# Patient Record
Sex: Male | Born: 1975 | Race: White | Hispanic: No | Marital: Married | Smoking: Never smoker
Health system: Southern US, Community
[De-identification: ages and names within clinical notes are randomized; demographics above are authoritative.]

## PROBLEM LIST (undated history)

## (undated) DIAGNOSIS — F419 Anxiety disorder, unspecified: Secondary | ICD-10-CM

## (undated) HISTORY — PX: LEG SURGERY: SHX1003

---

## 2001-08-06 ENCOUNTER — Ambulatory Visit (HOSPITAL_COMMUNITY): Admission: RE | Admit: 2001-08-06 | Discharge: 2001-08-06 | Payer: Self-pay | Admitting: Surgery

## 2001-08-06 ENCOUNTER — Encounter: Payer: Self-pay | Admitting: Surgery

## 2001-09-21 ENCOUNTER — Observation Stay (HOSPITAL_COMMUNITY): Admission: RE | Admit: 2001-09-21 | Discharge: 2001-09-22 | Payer: Self-pay | Admitting: Surgery

## 2001-12-19 ENCOUNTER — Ambulatory Visit: Admission: RE | Admit: 2001-12-19 | Discharge: 2002-03-19 | Payer: Self-pay | Admitting: Radiation Oncology

## 2005-08-04 ENCOUNTER — Emergency Department (HOSPITAL_COMMUNITY): Admission: EM | Admit: 2005-08-04 | Discharge: 2005-08-05 | Payer: Self-pay | Admitting: Emergency Medicine

## 2006-09-24 IMAGING — CT CT HEAD WO/W CM
1 of 2 series · 13 of 30 positions shown, 17 images · IV contrast (omnipaque)
Comparison: No prior studies.

CLINICAL DATA: History of sarcoma.  Altered level of consciousness.
HEAD CT WITHOUT AND WITH CONTRAST:
TECHNIQUE: Contiguous axial images were obtained from the base of the skull through the vertex according to standard protocol before and after administration of intravenous contrast.
Contrast:  100 cc Omnipaque 300

[Series 2: head_seq 4.5 h45s st · axial · 0.43mm/px · z∈[+1313,+1448]mm · 13 of 36 slices shown, 17 images]
[im 3/36  brain]
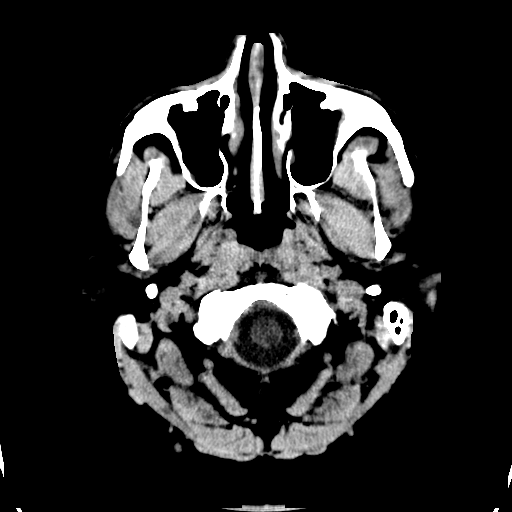
[im 3/36  bone]
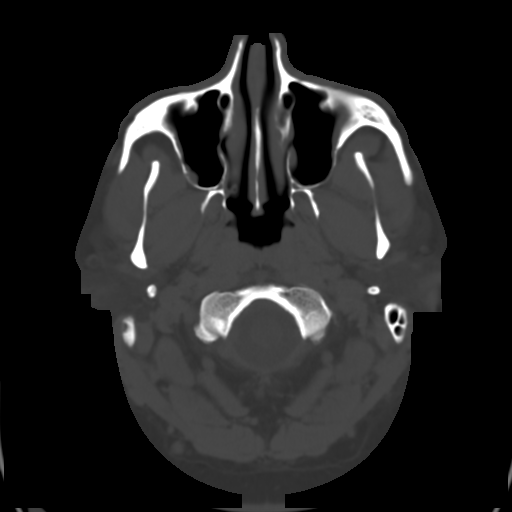
[im 6/36  brain]
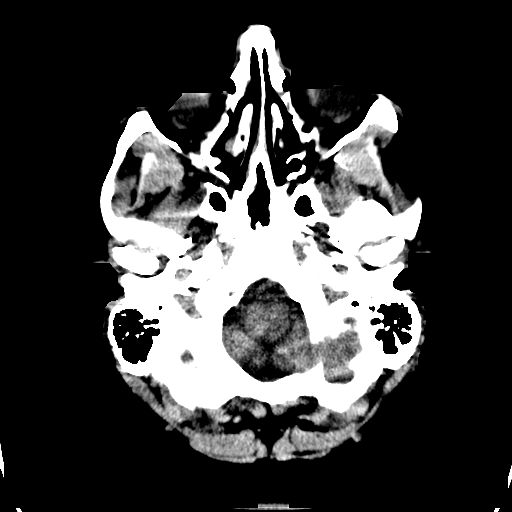
[im 8/36  brain]
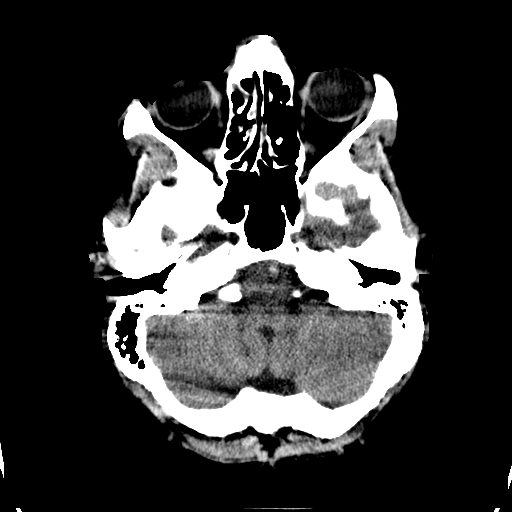
[im 11/36  brain]
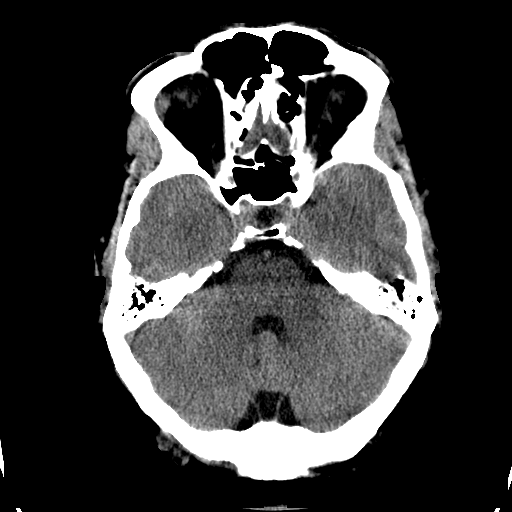
[im 13/36  brain]
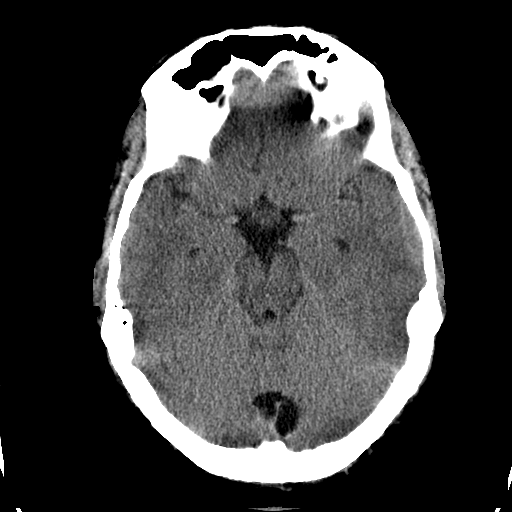
[im 13/36  bone]
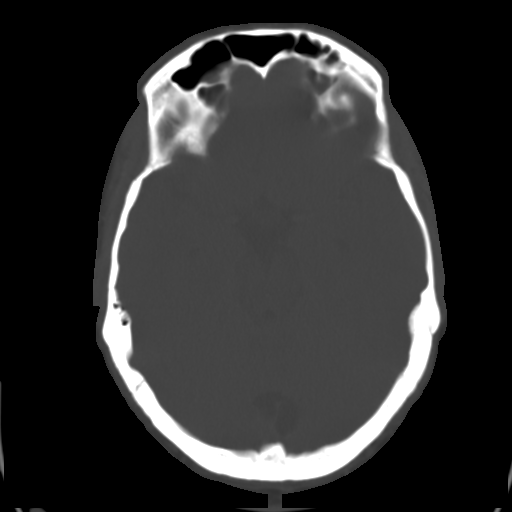
[im 16/36  brain]
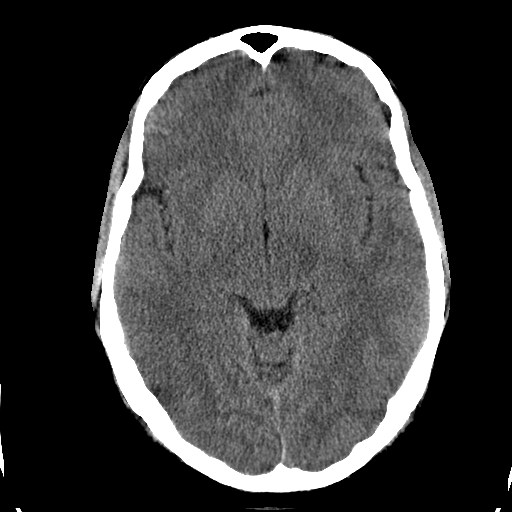
[im 18/36  brain]
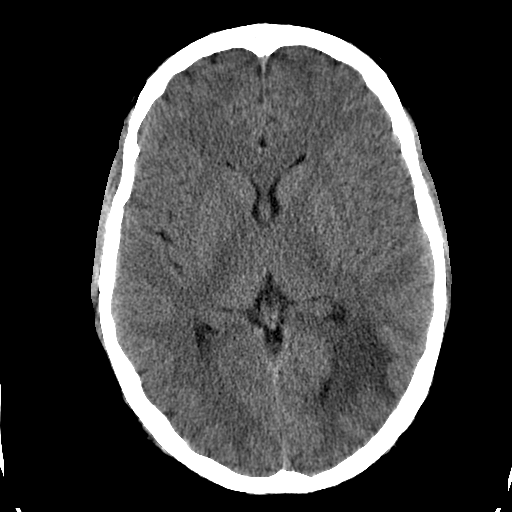
[im 21/36  brain]
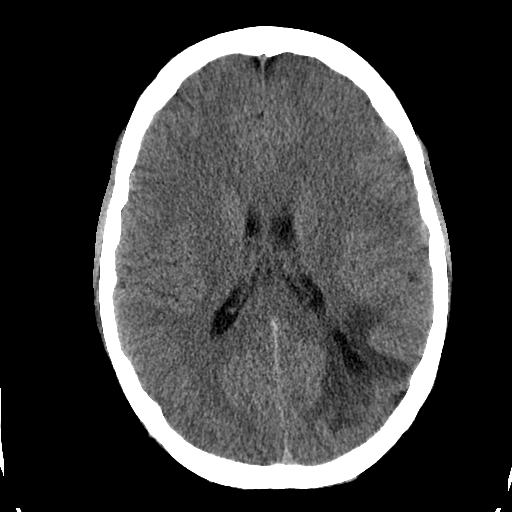
[im 23/36  brain]
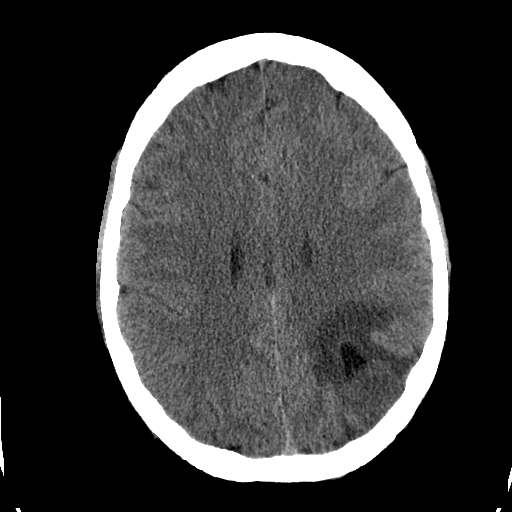
[im 23/36  bone]
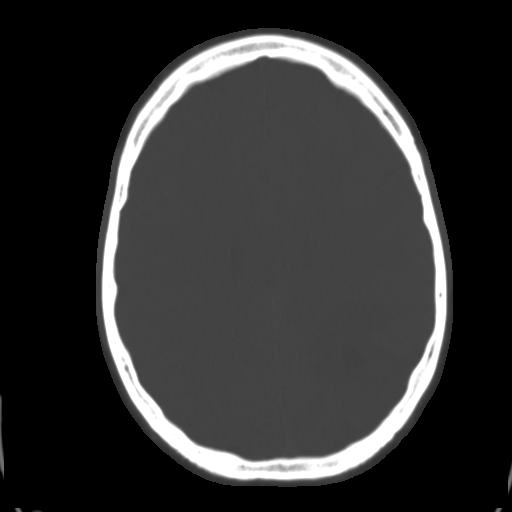
[im 26/36  brain]
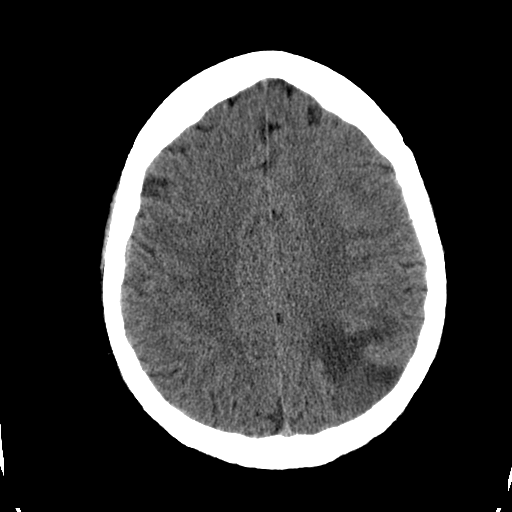
[im 28/36  brain]
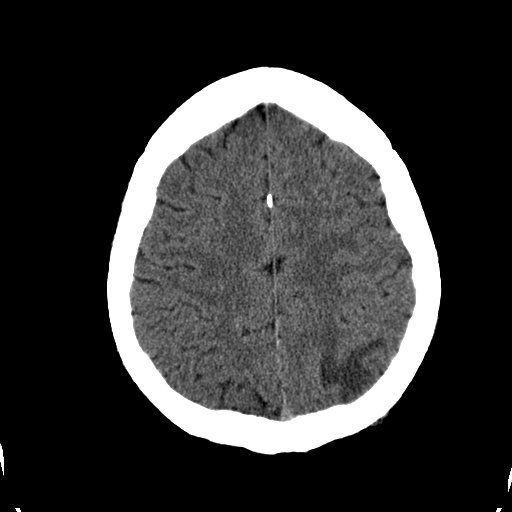
[im 31/36  brain]
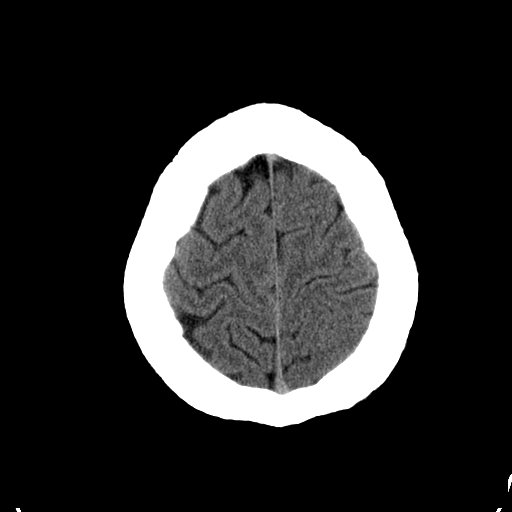
[im 33/36  brain]
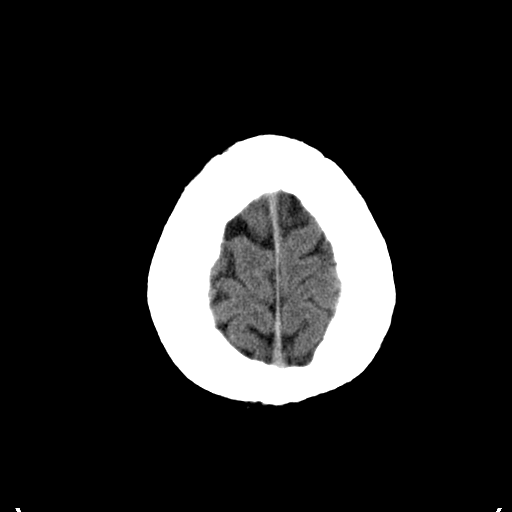
[im 33/36  bone]
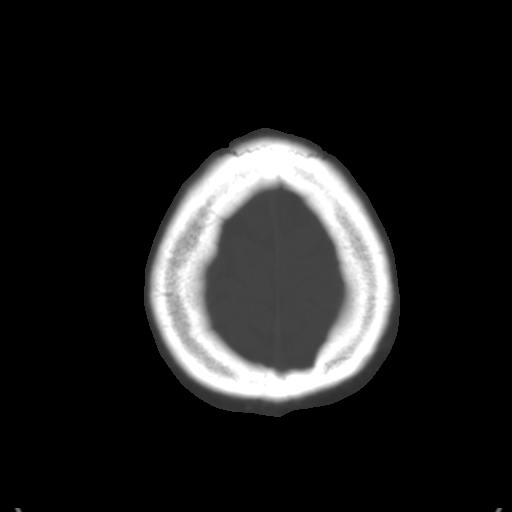

[13 of 30 positions shown; findings below may reference images not displayed]

FINDINGS: There is a 2.8 x 2.2 cm rounded enhancing mass in the left parietal lobe as shown on image 23 of series 4, with surrounding vasogenic edema.  The left lateral ventricle has an unusual appearance in association with this mass, extending cephalad in a dilated fashion to subtend this mass.  This may reflect a prior infarct underlying the mass.  Although this mass does enhance, it does not enhance avidly and sensitivity in detecting small additional metastatic lesions to the brain would be considerably higher on MRI than on today?s CT.  I do not visualize any additional intracranial lesions.  
There is chronic maxillary and ethmoid sinusitis.  The mass does not appear to be eroding the inner table of the skull although it does sit immediately adjacent to the dura.
IMPRESSION: 2.2 x 2.8 cm mass in the left parietal lobe with surrounding vasogenic edema, and ipsilateral dilatation of the occipital horn of the left lateral ventricle extending to this mass, likely due to encephalomalacia of the subtending white matter.

## 2006-10-13 ENCOUNTER — Ambulatory Visit (HOSPITAL_BASED_OUTPATIENT_CLINIC_OR_DEPARTMENT_OTHER): Admission: RE | Admit: 2006-10-13 | Discharge: 2006-10-13 | Payer: Self-pay | Admitting: Orthopedic Surgery

## 2017-09-17 ENCOUNTER — Emergency Department (HOSPITAL_BASED_OUTPATIENT_CLINIC_OR_DEPARTMENT_OTHER)
Admission: EM | Admit: 2017-09-17 | Discharge: 2017-09-17 | Disposition: A | Discharge status: Home / Self Care | Payer: PRIVATE HEALTH INSURANCE | Attending: Physician Assistant | Admitting: Physician Assistant

## 2017-09-17 ENCOUNTER — Other Ambulatory Visit: Payer: Self-pay

## 2017-09-17 ENCOUNTER — Encounter (HOSPITAL_BASED_OUTPATIENT_CLINIC_OR_DEPARTMENT_OTHER): Payer: Self-pay | Admitting: Emergency Medicine

## 2017-09-17 ENCOUNTER — Emergency Department (HOSPITAL_BASED_OUTPATIENT_CLINIC_OR_DEPARTMENT_OTHER): Payer: PRIVATE HEALTH INSURANCE

## 2017-09-17 DIAGNOSIS — S99921A Unspecified injury of right foot, initial encounter: Secondary | ICD-10-CM | POA: Diagnosis present

## 2017-09-17 DIAGNOSIS — Y9323 Activity, snow (alpine) (downhill) skiing, snow boarding, sledding, tobogganing and snow tubing: Secondary | ICD-10-CM | POA: Insufficient documentation

## 2017-09-17 DIAGNOSIS — Z79899 Other long term (current) drug therapy: Secondary | ICD-10-CM | POA: Insufficient documentation

## 2017-09-17 DIAGNOSIS — S92424A Nondisplaced fracture of distal phalanx of right great toe, initial encounter for closed fracture: Secondary | ICD-10-CM | POA: Diagnosis not present

## 2017-09-17 DIAGNOSIS — X509XXA Other and unspecified overexertion or strenuous movements or postures, initial encounter: Secondary | ICD-10-CM | POA: Diagnosis not present

## 2017-09-17 DIAGNOSIS — Y999 Unspecified external cause status: Secondary | ICD-10-CM | POA: Insufficient documentation

## 2017-09-17 DIAGNOSIS — Y929 Unspecified place or not applicable: Secondary | ICD-10-CM | POA: Insufficient documentation

## 2017-09-17 HISTORY — DX: Anxiety disorder, unspecified: F41.9

## 2017-09-17 NOTE — ED Triage Notes (Signed)
Patient states that he went snowboarding yesterday  - the patient reports that his snow boot was too tight

## 2017-09-17 NOTE — Discharge Instructions (Signed)
Be very careful with your toe.  Please used hard soled postop boot at home.  Please follow-up.  You can keep it buddy taped to promote healing.

## 2017-09-17 NOTE — ED Provider Notes (Signed)
[facility] EMERGENCY DEPARTMENT Provider Note   CSN: [id number] Arrival date & time: 09/17/17  1041     History   Chief Complaint Chief Complaint  Patient presents with  . Toe Injury    HPI Wesley Santiago is a 41 y.o. male.  HPI   Patient is a 41 year old male presenting with right toe injury.  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  [facility] and tried to put his foot into a snow boot.  He had to go up a size he still felt like his toe was may be curled under a little bit.  He then spent the whole day snowboarding, which is a new sport for him.  He took off his oot and found out that he had had a bruised red blistered toe today. No pain.   Past Medical History:  Diagnosis Date  . Anxiety     There are no active problems to display for this patient.   Past Surgical History:  Procedure Laterality Date  . LEG SURGERY         Home Medications    Prior to Admission medications   Medication Sig Start Date End Date Taking? Authorizing Provider  escitalopram (LEXAPRO) 20 MG tablet Take 20 mg by mouth daily.   Yes [provider]    Family History History reviewed. No pertinent family history.  Social History Social History   Tobacco Use  . Smoking status: Never Smoker  . Smokeless tobacco: Never Used  Substance Use Topics  . Alcohol use: Yes    Frequency: Never    Comment: occ  . Drug use: No     Allergies   Patient has no known allergies.   Review of Systems Review of Systems  Constitutional: Negative for activity change.  Respiratory: Negative for shortness of breath.   Cardiovascular: Negative for chest pain.  Gastrointestinal: Negative for abdominal pain.     Physical Exam Updated Vital Signs BP (!) 126/104 (BP Location: Left Arm)   Pulse 85   Temp 98.7 F (37.1 C) (Oral)   Resp 18   Ht 6\' 3"  (1.905 m)   Wt  129.3 kg (285 lb)   SpO2 100%   BMI 35.62 kg/m   Physical Exam  Constitutional: He is oriented to person, place, and time. He appears well-nourished.  HENT:  Head: Normocephalic.  Eyes: Conjunctivae are normal.  Cardiovascular: Normal rate.  Pulmonary/Chest: Effort normal.  Musculoskeletal:  Right great toe with blistering, bruising, see picture.  No pain.   Neurological: He is oriented to person, place, and time.  Skin: Skin is warm and dry. He is not diaphoretic.  Psychiatric: He has a normal mood and affect. His behavior is normal.       ED Treatments / Results  Labs (all labs ordered are listed, but only abnormal results are displayed) Labs Reviewed - No data to display  EKG  EKG Interpretation None       Radiology No results found.  Procedures Procedures (including critical care time)  Medications Ordered in ED Medications - No data to display   Initial Impression / Assessment and Plan / ED Course  I have reviewed the triage vital signs and the nursing notes.  Pertinent labs & imaging results that were available during my care of the patient were reviewed by me and considered in my medical decision making (see chart for  details).     Patient is a 41 year old male presenting with right toe injury.  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  [facility] and tried to put his foot into a snow boot.  He had to go up a size he still felt like his toe was may be curled under a little bit.  He then spent the whole day snowboarding, which is a new sport for him.  He took off his oot and found out that he had had a bruised red blistered toe today. No pain.   11:20 AM Since patient has very poor sensation in this toe already.  I think this is likely represents perhaps an occult fracture, or mild crush injury to the area for insensate toe.  Will get x-ray to rule out  fracture.  Otherwise will dress, have patient wear hard soled shoe, and follow-up with orthopedics.  Final Clinical Impressions(s) / ED Diagnoses   Final diagnoses:  None    ED Discharge Orders    None       Abelino DerrickMackuen, Michal Callicott Lyn, MD 09/21/17 [phone]

## 2018-09-09 DEATH — deceased
